# Patient Record
Sex: Female | Born: 1955 | Race: Black or African American | Hispanic: No | Marital: Single | Smoking: Never smoker
Health system: Southern US, Community
[De-identification: ages and names within clinical notes are randomized; demographics above are authoritative.]

## PROBLEM LIST (undated history)

## (undated) HISTORY — PX: ABDOMINAL HYSTERECTOMY: SHX81

## (undated) HISTORY — PX: CHOLECYSTECTOMY: SHX55

## (undated) HISTORY — PX: KNEE ARTHROSCOPY: SUR90

---

## 2016-10-14 ENCOUNTER — Encounter (HOSPITAL_BASED_OUTPATIENT_CLINIC_OR_DEPARTMENT_OTHER): Payer: Self-pay | Admitting: Emergency Medicine

## 2016-10-14 ENCOUNTER — Emergency Department (HOSPITAL_BASED_OUTPATIENT_CLINIC_OR_DEPARTMENT_OTHER)
Admission: EM | Admit: 2016-10-14 | Discharge: 2016-10-14 | Disposition: A | Discharge status: Home / Self Care | Payer: Self-pay | Attending: Emergency Medicine | Admitting: Emergency Medicine

## 2016-10-14 DIAGNOSIS — R42 Dizziness and giddiness: Secondary | ICD-10-CM | POA: Insufficient documentation

## 2016-10-14 DIAGNOSIS — N390 Urinary tract infection, site not specified: Secondary | ICD-10-CM | POA: Insufficient documentation

## 2016-10-14 LAB — CBC WITH DIFFERENTIAL/PLATELET
BASOS ABS: 0 10*3/uL (ref 0.0–0.1)
BASOS PCT: 0 %
EOS ABS: 0 10*3/uL (ref 0.0–0.7)
EOS PCT: 0 %
HCT: 39.3 % (ref 36.0–46.0)
Hemoglobin: 13.3 g/dL (ref 12.0–15.0)
LYMPHS PCT: 51 %
Lymphs Abs: 3.2 10*3/uL (ref 0.7–4.0)
MCH: 30.2 pg (ref 26.0–34.0)
MCHC: 33.8 g/dL (ref 30.0–36.0)
MCV: 89.1 fL (ref 78.0–100.0)
MONO ABS: 0.6 10*3/uL (ref 0.1–1.0)
Monocytes Relative: 10 %
Neutro Abs: 2.4 10*3/uL (ref 1.7–7.7)
Neutrophils Relative %: 39 %
PLATELETS: 236 10*3/uL (ref 150–400)
RBC: 4.41 MIL/uL (ref 3.87–5.11)
RDW: 12.2 % (ref 11.5–15.5)
WBC: 6.2 10*3/uL (ref 4.0–10.5)

## 2016-10-14 LAB — BASIC METABOLIC PANEL
ANION GAP: 9 (ref 5–15)
BUN: 11 mg/dL (ref 6–20)
CALCIUM: 9.1 mg/dL (ref 8.9–10.3)
CO2: 28 mmol/L (ref 22–32)
Chloride: 101 mmol/L (ref 101–111)
Creatinine, Ser: 0.77 mg/dL (ref 0.44–1.00)
GFR calc Af Amer: >60 mL/min (ref >60)
Glucose, Bld: 108 mg/dL — ABNORMAL HIGH (ref 65–99)
POTASSIUM: 3.7 mmol/L (ref 3.5–5.1)
SODIUM: 138 mmol/L (ref 135–145)

## 2016-10-14 LAB — URINALYSIS, ROUTINE W REFLEX MICROSCOPIC
BILIRUBIN URINE: NEGATIVE
Glucose, UA: NEGATIVE mg/dL
Ketones, ur: 15 mg/dL — AB
NITRITE: NEGATIVE
PH: 5 (ref 5.0–8.0)
Protein, ur: NEGATIVE mg/dL
SPECIFIC GRAVITY, URINE: 1.024 (ref 1.005–1.030)

## 2016-10-14 LAB — URINALYSIS, MICROSCOPIC (REFLEX)

## 2016-10-14 LAB — CBG MONITORING, ED: GLUCOSE-CAPILLARY: 102 mg/dL — AB (ref 65–99)

## 2016-10-14 MED ORDER — CEPHALEXIN 500 MG PO CAPS: 500.0000 mg | ORAL_CAPSULE | Freq: Two times a day (BID) | ORAL | 0 refills | Status: DC

## 2016-10-14 MED ORDER — SODIUM CHLORIDE 0.9 % IV BOLUS (SEPSIS)
1000.0000 mL | Freq: Once | INTRAVENOUS | Status: AC
  Administered 2016-10-14: 1000 mL via INTRAVENOUS

## 2016-10-14 MED ORDER — CEPHALEXIN 250 MG PO CAPS
1000.0000 mg | ORAL_CAPSULE | Freq: Once | ORAL | Status: AC
  Administered 2016-10-14: 1000 mg via ORAL
  Filled 2016-10-14: qty 4

## 2016-10-14 NOTE — ED Notes (Signed)
EDP into room 

## 2016-10-14 NOTE — ED Triage Notes (Signed)
PT presents with dizziness, headache and not feeling like herself today.

## 2016-10-14 NOTE — ED Provider Notes (Signed)
[facility] Provider Note: Lowella DellJ. Lane Karder Goodin, MD, FACEP  CSN: [id number] MRN: [id number] ARRIVAL: 10/14/16 at 0102 ROOM: MH02/[facility]02   CHIEF COMPLAINT  Dizziness   HISTORY OF PRESENT ILLNESS  Krystal Gomez is a 61 y.o. female with a one-day history of dizziness by which she means lightheadedness. Symptoms are moderate and worse when standing rapidly. She denies associated chest pain, shortness of breath, vomiting, diarrhea, abdominal pain or fever. She has had chills and nausea. She has had increased thirst and increased frequency of urination but denies dysuria.    History reviewed. No pertinent past medical history.  Past Surgical History:  Procedure Laterality Date  . ABDOMINAL HYSTERECTOMY      No family history on file.  Social History  Substance Use Topics  . Smoking status: Not on file  . Smokeless tobacco: Not on file  . Alcohol use Not on file    Prior to Admission medications   Not on File    Allergies Sulfa antibiotics   REVIEW OF SYSTEMS  Negative except as noted here or in the History of Present Illness.   PHYSICAL EXAMINATION  Initial Vital Signs Blood pressure (!) 148/72, pulse 74, temperature 98.3 F (36.8 C), temperature source Oral, resp. rate 13, height 5\' 2"  (1.575 m), weight 120.2 kg (265 lb), SpO2 100 %.  Examination General: Well-developed, well-nourished female in no acute distress; appearance consistent with age of record HENT: normocephalic; atraumatic Eyes: pupils equal, round and reactive to light; extraocular muscles intact Neck: supple Heart: regular rate and rhythm Lungs: clear to auscultation bilaterally Abdomen: soft; nondistended; mild suprapubic tenderness; no masses or hepatosplenomegaly; bowel sounds present Extremities: No deformity; full range of motion; pulses normal Neurologic: Awake, alert and oriented; motor function intact in all extremities and symmetric; no facial droop Skin: Warm and dry Psychiatric:  Normal mood and affect   RESULTS  Summary of this visit's results, reviewed by myself:   EKG Interpretation  Date/Time:  Sunday October 14 2016 01:15:01 EDT Ventricular Rate:  85 PR Interval:    QRS Duration: 86 QT Interval:  372 QTC Calculation: 443 R Axis:   55 Text Interpretation:  Sinus rhythm Normal ECG No previous ECGs available Confirmed by Paula LibraMolpus, Tahja Liao ([id number]) on 10/14/2016 2:18:36 AM      Laboratory Studies: Results for orders placed or performed during the hospital encounter of 10/14/16 (from the past 24 hour(s))  Urinalysis, Routine w reflex microscopic     Status: Abnormal   Collection Time: 10/14/16  1:21 AM  Result Value Ref Range   Color, Urine YELLOW YELLOW   APPearance CLOUDY (A) CLEAR   Specific Gravity, Urine 1.024 1.005 - 1.030   pH 5.0 5.0 - 8.0   Glucose, UA NEGATIVE NEGATIVE mg/dL   Hgb urine dipstick TRACE (A) NEGATIVE   Bilirubin Urine NEGATIVE NEGATIVE   Ketones, ur 15 (A) NEGATIVE mg/dL   Protein, ur NEGATIVE NEGATIVE mg/dL   Nitrite NEGATIVE NEGATIVE   Leukocytes, UA MODERATE (A) NEGATIVE  Urinalysis, Microscopic (reflex)     Status: Abnormal   Collection Time: 10/14/16  1:21 AM  Result Value Ref Range   RBC / HPF 0-5 0 - 5 RBC/hpf   WBC, UA 6-30 0 - 5 WBC/hpf   Bacteria, UA MANY (A) NONE SEEN   Squamous Epithelial / LPF TOO NUMEROUS TO COUNT (A) NONE SEEN  CBG monitoring, ED     Status: Abnormal   Collection Time: 10/14/16  3:01 AM  Result Value Ref Range  Glucose-Capillary 102 (H) 65 - 99 mg/dL  CBC with Differential/Platelet     Status: None   Collection Time: 10/14/16  3:50 AM  Result Value Ref Range   WBC 6.2 4.0 - 10.5 K/uL   RBC 4.41 3.87 - 5.11 MIL/uL   Hemoglobin 13.3 12.0 - 15.0 g/dL   HCT 81.1 91.4 - 78.2 %   MCV 89.1 78.0 - 100.0 fL   MCH 30.2 26.0 - 34.0 pg   MCHC 33.8 30.0 - 36.0 g/dL   RDW 95.6 21.3 - 08.6 %   Platelets 236 150 - 400 K/uL   Neutrophils Relative % 39 %   Neutro Abs 2.4 1.7 - 7.7 K/uL   Lymphocytes  Relative 51 %   Lymphs Abs 3.2 0.7 - 4.0 K/uL   Monocytes Relative 10 %   Monocytes Absolute 0.6 0.1 - 1.0 K/uL   Eosinophils Relative 0 %   Eosinophils Absolute 0.0 0.0 - 0.7 K/uL   Basophils Relative 0 %   Basophils Absolute 0.0 0.0 - 0.1 K/uL  Basic metabolic panel     Status: Abnormal   Collection Time: 10/14/16  3:50 AM  Result Value Ref Range   Sodium 138 135 - 145 mmol/L   Potassium 3.7 3.5 - 5.1 mmol/L   Chloride 101 101 - 111 mmol/L   CO2 28 22 - 32 mmol/L   Glucose, Bld 108 (H) 65 - 99 mg/dL   BUN 11 6 - 20 mg/dL   Creatinine, Ser 5.78 0.44 - 1.00 mg/dL   Calcium 9.1 8.9 - 46.9 mg/dL   GFR calc non Af Amer >60 >60 mL/min   GFR calc Af Amer >60 >60 mL/min   Anion gap 9 5 - 15   Imaging Studies: No results found.  ED COURSE  Nursing notes and initial vitals signs, including pulse oximetry, reviewed.  Vitals:   10/14/16 0230 10/14/16 0300 10/14/16 0330 10/14/16 0400  BP: 130/77 (!) 145/74 130/75 (!) 145/95  Pulse:  73 66 66  Resp: 15 19 14 12   Temp:      TempSrc:      SpO2:  100% 100% 100%  Weight:      Height:       4:40 AM Feels better after IV fluid bolus. We'll treat for urinary tract infection as the patient is symptomatic.  PROCEDURES    ED DIAGNOSES     ICD-10-CM   1. Lightheadedness R42   2. Lower urinary tract infectious disease N39.0        Ermine Spofford, MD 10/14/16 [phone]

## 2016-10-15 LAB — URINE CULTURE

## 2017-06-15 ENCOUNTER — Emergency Department (HOSPITAL_BASED_OUTPATIENT_CLINIC_OR_DEPARTMENT_OTHER)
Admission: EM | Admit: 2017-06-15 | Discharge: 2017-06-15 | Disposition: A | Discharge status: Home / Self Care | Payer: BLUE CROSS/BLUE SHIELD | Attending: Emergency Medicine | Admitting: Emergency Medicine

## 2017-06-15 ENCOUNTER — Other Ambulatory Visit: Payer: Self-pay

## 2017-06-15 ENCOUNTER — Emergency Department (HOSPITAL_BASED_OUTPATIENT_CLINIC_OR_DEPARTMENT_OTHER): Payer: BLUE CROSS/BLUE SHIELD

## 2017-06-15 ENCOUNTER — Encounter (HOSPITAL_BASED_OUTPATIENT_CLINIC_OR_DEPARTMENT_OTHER): Payer: Self-pay | Admitting: Emergency Medicine

## 2017-06-15 DIAGNOSIS — M545 Low back pain: Secondary | ICD-10-CM | POA: Insufficient documentation

## 2017-06-15 DIAGNOSIS — R200 Anesthesia of skin: Secondary | ICD-10-CM | POA: Insufficient documentation

## 2017-06-15 DIAGNOSIS — R42 Dizziness and giddiness: Secondary | ICD-10-CM | POA: Diagnosis not present

## 2017-06-15 DIAGNOSIS — R531 Weakness: Secondary | ICD-10-CM | POA: Diagnosis not present

## 2017-06-15 LAB — URINALYSIS, ROUTINE W REFLEX MICROSCOPIC
Bilirubin Urine: NEGATIVE
GLUCOSE, UA: NEGATIVE mg/dL
Ketones, ur: NEGATIVE mg/dL
Leukocytes, UA: NEGATIVE
Nitrite: NEGATIVE
PH: 5.5 (ref 5.0–8.0)
PROTEIN: NEGATIVE mg/dL
Specific Gravity, Urine: >1.03 — ABNORMAL HIGH (ref 1.005–1.030)

## 2017-06-15 LAB — CBC
HCT: 39.8 % (ref 36.0–46.0)
Hemoglobin: 13.3 g/dL (ref 12.0–15.0)
MCH: 29.6 pg (ref 26.0–34.0)
MCHC: 33.4 g/dL (ref 30.0–36.0)
MCV: 88.4 fL (ref 78.0–100.0)
PLATELETS: 256 10*3/uL (ref 150–400)
RBC: 4.5 MIL/uL (ref 3.87–5.11)
RDW: 12.2 % (ref 11.5–15.5)
WBC: 6.3 10*3/uL (ref 4.0–10.5)

## 2017-06-15 LAB — URINALYSIS, MICROSCOPIC (REFLEX)

## 2017-06-15 LAB — DIFFERENTIAL
Basophils Absolute: 0 10*3/uL (ref 0.0–0.1)
Basophils Relative: 0 %
EOS ABS: 0 10*3/uL (ref 0.0–0.7)
Eosinophils Relative: 0 %
Lymphocytes Relative: 49 %
Lymphs Abs: 3.1 10*3/uL (ref 0.7–4.0)
Monocytes Absolute: 0.5 10*3/uL (ref 0.1–1.0)
Monocytes Relative: 8 %
NEUTROS PCT: 43 %
Neutro Abs: 2.7 10*3/uL (ref 1.7–7.7)

## 2017-06-15 LAB — COMPREHENSIVE METABOLIC PANEL
ALBUMIN: 3.8 g/dL (ref 3.5–5.0)
ALT: 14 U/L (ref 14–54)
ANION GAP: 7 (ref 5–15)
AST: 18 U/L (ref 15–41)
Alkaline Phosphatase: 87 U/L (ref 38–126)
BILIRUBIN TOTAL: 0.6 mg/dL (ref 0.3–1.2)
BUN: 13 mg/dL (ref 6–20)
CO2: 26 mmol/L (ref 22–32)
Calcium: 9.1 mg/dL (ref 8.9–10.3)
Chloride: 104 mmol/L (ref 101–111)
Creatinine, Ser: 0.76 mg/dL (ref 0.44–1.00)
GFR calc Af Amer: >60 mL/min (ref >60)
GFR calc non Af Amer: >60 mL/min (ref >60)
GLUCOSE: 99 mg/dL (ref 65–99)
POTASSIUM: 3.8 mmol/L (ref 3.5–5.1)
SODIUM: 137 mmol/L (ref 135–145)
TOTAL PROTEIN: 7.5 g/dL (ref 6.5–8.1)

## 2017-06-15 LAB — RAPID URINE DRUG SCREEN, HOSP PERFORMED
Amphetamines: NOT DETECTED
BARBITURATES: NOT DETECTED
BENZODIAZEPINES: NOT DETECTED
COCAINE: NOT DETECTED
Opiates: NOT DETECTED
Tetrahydrocannabinol: NOT DETECTED

## 2017-06-15 LAB — PROTIME-INR
INR: 0.96
PROTHROMBIN TIME: 12.7 s (ref 11.4–15.2)

## 2017-06-15 LAB — TROPONIN I: Troponin I: <0.03 ng/mL (ref <0.03)

## 2017-06-15 LAB — ETHANOL: Alcohol, Ethyl (B): <10 mg/dL (ref <10)

## 2017-06-15 LAB — APTT: aPTT: 34 seconds (ref 24–36)

## 2017-06-15 MED ORDER — MECLIZINE HCL 25 MG PO TABS
25.0000 mg | ORAL_TABLET | Freq: Once | ORAL | Status: AC
  Administered 2017-06-15: 25 mg via ORAL
  Filled 2017-06-15: qty 1

## 2017-06-15 MED ORDER — MECLIZINE HCL 25 MG PO TABS: 25.0000 mg | ORAL_TABLET | Freq: Three times a day (TID) | ORAL | 0 refills | Status: AC | PRN

## 2017-06-15 NOTE — ED Notes (Signed)
Patient transported to X-ray 

## 2017-06-15 NOTE — ED Provider Notes (Signed)
Patient's MRI is negative.  No acute stroke or mass.  She is feeling better after Antivert.  She will be given Antivert prescription and instructed to follow-up with neurology.  I doubt these are TIAs.  Discharge home with return precautions.   Pricilla LovelessGoldston, Trapper Meech, MD 06/15/17 [phone]

## 2017-06-15 NOTE — ED Notes (Signed)
ED Provider at bedside. 

## 2017-06-15 NOTE — ED Provider Notes (Signed)
[facility] EMERGENCY DEPARTMENT Provider Note   CSN: [id number] Arrival date & time: 06/15/17  1250     History   Chief Complaint Chief Complaint  Patient presents with  . Weakness    HPI Krystal Gomez is a 62 y.o. female.  Patient presents with several complaints.  She has had weakness lightheadedness and some dizziness and some room spinning for a week.  Right lower lumbar back pain for a week.  Has had intermittent right arm numbness usually last for 10 minutes as happen several times over the past few days.  None present now.  No headache as mentioned yes to dizziness and vertigo.  Patient has not had vertigo in the past.  Patient currently does not have a primary care doctor.      History reviewed. No pertinent past medical history.  There are no active problems to display for this patient.   Past Surgical History:  Procedure Laterality Date  . ABDOMINAL HYSTERECTOMY    . KNEE ARTHROSCOPY      OB History    No data available       Home Medications    Prior to Admission medications   Medication Sig Start Date End Date Taking? Authorizing Provider  cephALEXin (KEFLEX) 500 MG capsule Take 1 capsule (500 mg total) by mouth 2 (two) times daily. 10/14/16   Molpus, John, MD    Family History No family history on file.  Social History Social History   Tobacco Use  . Smoking status: Never Smoker  . Smokeless tobacco: Never Used  Substance Use Topics  . Alcohol use: No    Frequency: Never  . Drug use: No     Allergies   Sulfa antibiotics   Review of Systems Review of Systems  Constitutional: Negative for fever.  HENT: Negative for congestion.   Eyes: Negative for visual disturbance.  Respiratory: Negative for shortness of breath.   Cardiovascular: Negative for chest pain.  Gastrointestinal: Negative for abdominal pain.  Genitourinary: Negative for dysuria.  Musculoskeletal: Positive for back pain. Negative for myalgias.  Skin:  Negative for rash.  Neurological: Positive for dizziness, weakness, light-headedness and numbness. Negative for syncope, speech difficulty and headaches.  Hematological: Does not bruise/bleed easily.  Psychiatric/Behavioral: Negative for confusion.     Physical Exam Updated Vital Signs BP (!) 148/79   Pulse 75   Temp 98.4 F (36.9 C)   Resp 11   Ht 1.575 m (5\' 2" )   Wt 97.5 kg (215 lb)   SpO2 100%   BMI 39.32 kg/m   Physical Exam  Constitutional: She is oriented to person, place, and time. She appears well-developed and well-nourished. No distress.  HENT:  Head: Normocephalic and atraumatic.  Mouth/Throat: Oropharynx is clear and moist.  Eyes: Conjunctivae and EOM are normal. Pupils are equal, round, and reactive to light.  Neck: Neck supple.  Cardiovascular: Normal rate, regular rhythm and normal heart sounds.  Pulmonary/Chest: Effort normal and breath sounds normal. No respiratory distress.  Abdominal: Soft. Bowel sounds are normal. There is no tenderness.  Musculoskeletal: Normal range of motion. She exhibits tenderness.  Mild tenderness to palpation to the right lumbar area.  The right upper extremity with good radial pulse good cap refill sensation intact.  Neurological: She is alert and oriented to person, place, and time. No cranial nerve deficit or sensory deficit. She exhibits normal muscle tone. Coordination normal.  Skin: Skin is warm.  Nursing note and vitals reviewed.    ED Treatments /  Results  Labs (all labs ordered are listed, but only abnormal results are displayed) Labs Reviewed  PROTIME-INR  APTT  CBC  DIFFERENTIAL  COMPREHENSIVE METABOLIC PANEL  TROPONIN I  ETHANOL  RAPID URINE DRUG SCREEN, HOSP PERFORMED  URINALYSIS, ROUTINE W REFLEX MICROSCOPIC    EKG  EKG Interpretation  Date/Time:  Saturday June 15 2017 13:02:12 EST Ventricular Rate:  86 PR Interval:    QRS Duration: 80 QT Interval:  361 QTC Calculation: 432 R Axis:   45 Text  Interpretation:  Sinus rhythm Confirmed by Vanetta MuldersZackowski, Maria Coin [phone] on 06/15/2017 1:04:27 PM       Radiology Dg Chest 2 View  Result Date: 06/15/2017 CLINICAL DATA:  Fever, headache, dizziness EXAM: CHEST - 2 VIEW COMPARISON:  04/12/2015 FINDINGS: Heart and mediastinal contours are within normal limits. No focal opacities or effusions. No acute bony abnormality. IMPRESSION: No active cardiopulmonary disease. Electronically Signed   By: Charlett NoseKevin  Dover M.D.   On: 06/15/2017 15:58    Procedures Procedures (including critical care time)  Medications Ordered in ED Medications - No data to display   Initial Impression / Assessment and Plan / ED Course  I have reviewed the triage vital signs and the nursing notes.  Pertinent labs & imaging results that were available during my care of the patient were reviewed by me and considered in my medical decision making (see chart for details).     Patient with multiple symptoms.  The most worrisome symptoms are the vertigo with dizziness that has been present for a week which could represent a stroke.  Also the right arm numbness but it is intermittent.  This patient will get a trial of Antivert here MRI has been arranged to be done here.  If MRI negative patient can be discharged home with follow-up with neurology for vertigo with Antivert.  Patient's basic labs without significant abnormalities.  Chest x-ray without any acute findings.  Head CT was not done first because MRI which shutting down at 5 so went directly to MRI with their present permission.  Final Clinical Impressions(s) / ED Diagnoses   Final diagnoses:  Dizziness  Weakness  Right arm numbness    ED Discharge Orders    None       Vanetta MuldersZackowski, Orley Lawry, MD 06/15/17 1625

## 2017-06-15 NOTE — ED Triage Notes (Signed)
Pt presents with multiple complaints including generalized weakness, intermittent lightheadedness, nausea, back pain and L arm numbness x 1 week.

## 2017-06-15 NOTE — ED Notes (Signed)
Pt returned from MRI °

## 2017-06-17 LAB — URINE CULTURE

## 2017-07-15 ENCOUNTER — Encounter (HOSPITAL_BASED_OUTPATIENT_CLINIC_OR_DEPARTMENT_OTHER): Payer: Self-pay | Admitting: Emergency Medicine

## 2017-07-15 ENCOUNTER — Other Ambulatory Visit: Payer: Self-pay

## 2017-07-15 ENCOUNTER — Emergency Department (HOSPITAL_BASED_OUTPATIENT_CLINIC_OR_DEPARTMENT_OTHER)
Admission: EM | Admit: 2017-07-15 | Discharge: 2017-07-15 | Disposition: A | Discharge status: Home / Self Care | Payer: No Typology Code available for payment source | Attending: Emergency Medicine | Admitting: Emergency Medicine

## 2017-07-15 DIAGNOSIS — Y92410 Unspecified street and highway as the place of occurrence of the external cause: Secondary | ICD-10-CM | POA: Diagnosis not present

## 2017-07-15 DIAGNOSIS — M549 Dorsalgia, unspecified: Secondary | ICD-10-CM | POA: Diagnosis not present

## 2017-07-15 DIAGNOSIS — Y998 Other external cause status: Secondary | ICD-10-CM | POA: Insufficient documentation

## 2017-07-15 DIAGNOSIS — M542 Cervicalgia: Secondary | ICD-10-CM | POA: Insufficient documentation

## 2017-07-15 DIAGNOSIS — Y9389 Activity, other specified: Secondary | ICD-10-CM | POA: Diagnosis not present

## 2017-07-15 MED ORDER — CYCLOBENZAPRINE HCL 10 MG PO TABS: 10.0000 mg | ORAL_TABLET | Freq: Three times a day (TID) | ORAL | 0 refills | Status: AC | PRN

## 2017-07-15 MED ORDER — IBUPROFEN 800 MG PO TABS
800.0000 mg | ORAL_TABLET | Freq: Once | ORAL | Status: AC
  Administered 2017-07-15: 800 mg via ORAL
  Filled 2017-07-15: qty 1

## 2017-07-15 MED ORDER — OXYCODONE-ACETAMINOPHEN 5-325 MG PO TABS
1.0000 | ORAL_TABLET | Freq: Once | ORAL | Status: AC
  Administered 2017-07-15: 1 via ORAL
  Filled 2017-07-15: qty 1

## 2017-07-15 MED ORDER — NAPROXEN 375 MG PO TABS: 375.0000 mg | ORAL_TABLET | Freq: Two times a day (BID) | ORAL | 0 refills | Status: AC | PRN

## 2017-07-15 MED FILL — NAPROXEN 375 MG TABLET: 375 | 10 days supply | Qty: 20 | Fill #0

## 2017-07-15 MED FILL — CYCLOBENZAPRINE HCL 10 MG T: 10 | 4 days supply | Qty: 12 | Fill #0
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
## 2017-07-16 ENCOUNTER — Encounter (HOSPITAL_BASED_OUTPATIENT_CLINIC_OR_DEPARTMENT_OTHER): Payer: Self-pay | Admitting: Emergency Medicine

## 2017-11-24 ENCOUNTER — Other Ambulatory Visit: Payer: Self-pay

## 2017-11-24 ENCOUNTER — Encounter (HOSPITAL_BASED_OUTPATIENT_CLINIC_OR_DEPARTMENT_OTHER): Payer: Self-pay | Admitting: Emergency Medicine

## 2017-11-24 ENCOUNTER — Emergency Department (HOSPITAL_BASED_OUTPATIENT_CLINIC_OR_DEPARTMENT_OTHER): Payer: BLUE CROSS/BLUE SHIELD

## 2017-11-24 ENCOUNTER — Emergency Department (HOSPITAL_BASED_OUTPATIENT_CLINIC_OR_DEPARTMENT_OTHER)
Admission: EM | Admit: 2017-11-24 | Discharge: 2017-11-24 | Disposition: A | Discharge status: Home / Self Care | Payer: BLUE CROSS/BLUE SHIELD | Attending: Emergency Medicine | Admitting: Emergency Medicine

## 2017-11-24 DIAGNOSIS — K5900 Constipation, unspecified: Secondary | ICD-10-CM | POA: Insufficient documentation

## 2017-11-24 DIAGNOSIS — Z79899 Other long term (current) drug therapy: Secondary | ICD-10-CM | POA: Insufficient documentation

## 2017-11-24 DIAGNOSIS — R109 Unspecified abdominal pain: Secondary | ICD-10-CM

## 2017-11-24 DIAGNOSIS — R1011 Right upper quadrant pain: Secondary | ICD-10-CM | POA: Diagnosis present

## 2017-11-24 LAB — LIPASE, BLOOD: Lipase: 23 U/L (ref 11–51)

## 2017-11-24 LAB — CBC WITH DIFFERENTIAL/PLATELET
BASOS ABS: 0 10*3/uL (ref 0.0–0.1)
Basophils Relative: 0 %
EOS PCT: 0 %
Eosinophils Absolute: 0 10*3/uL (ref 0.0–0.7)
HCT: 40.7 % (ref 36.0–46.0)
HEMOGLOBIN: 13.7 g/dL (ref 12.0–15.0)
Lymphocytes Relative: 42 %
Lymphs Abs: 2.4 10*3/uL (ref 0.7–4.0)
MCH: 29.7 pg (ref 26.0–34.0)
MCHC: 33.7 g/dL (ref 30.0–36.0)
MCV: 88.3 fL (ref 78.0–100.0)
Monocytes Absolute: 0.5 10*3/uL (ref 0.1–1.0)
Monocytes Relative: 9 %
NEUTROS ABS: 2.8 10*3/uL (ref 1.7–7.7)
NEUTROS PCT: 49 %
PLATELETS: 260 10*3/uL (ref 150–400)
RBC: 4.61 MIL/uL (ref 3.87–5.11)
RDW: 12.3 % (ref 11.5–15.5)
WBC: 5.6 10*3/uL (ref 4.0–10.5)

## 2017-11-24 LAB — URINALYSIS, ROUTINE W REFLEX MICROSCOPIC
BILIRUBIN URINE: NEGATIVE
GLUCOSE, UA: NEGATIVE mg/dL
Ketones, ur: NEGATIVE mg/dL
Leukocytes, UA: NEGATIVE
Nitrite: NEGATIVE
PH: 5 (ref 5.0–8.0)
Protein, ur: NEGATIVE mg/dL
SPECIFIC GRAVITY, URINE: 1.015 (ref 1.005–1.030)

## 2017-11-24 LAB — COMPREHENSIVE METABOLIC PANEL
ALK PHOS: 83 U/L (ref 38–126)
ALT: 17 U/L (ref 0–44)
AST: 23 U/L (ref 15–41)
Albumin: 3.7 g/dL (ref 3.5–5.0)
Anion gap: 9 (ref 5–15)
BUN: 8 mg/dL (ref 8–23)
CALCIUM: 9.1 mg/dL (ref 8.9–10.3)
CHLORIDE: 105 mmol/L (ref 98–111)
CO2: 25 mmol/L (ref 22–32)
CREATININE: 0.78 mg/dL (ref 0.44–1.00)
GFR calc Af Amer: >60 mL/min (ref >60)
Glucose, Bld: 103 mg/dL — ABNORMAL HIGH (ref 70–99)
Potassium: 4.5 mmol/L (ref 3.5–5.1)
SODIUM: 139 mmol/L (ref 135–145)
Total Bilirubin: 0.5 mg/dL (ref 0.3–1.2)
Total Protein: 7.2 g/dL (ref 6.5–8.1)

## 2017-11-24 LAB — URINALYSIS, MICROSCOPIC (REFLEX)

## 2017-11-24 MED ORDER — DICYCLOMINE HCL 20 MG PO TABS: 20.0000 mg | ORAL_TABLET | Freq: Two times a day (BID) | ORAL | 0 refills | Status: AC | PRN

## 2017-11-24 MED ORDER — POLYETHYLENE GLYCOL 3350 17 G PO PACK: 17.0000 g | PACK | Freq: Every day | ORAL | 0 refills | Status: AC | PRN

## 2017-11-24 NOTE — ED Provider Notes (Signed)
[facility] EMERGENCY DEPARTMENT Provider Note   CSN: [id number] Arrival date & time: 11/24/17  1126     History   Chief Complaint Chief Complaint  Patient presents with  . Flank Pain    HPI Krystal Gomez is a 62 y.o. female.  HPI Patient presents with 1 week of right flank pain that radiates to the right upper abdomen.  Associated with nausea.  States the pain is constantly present but peaks at times.  Denies any dysuria, hematuria, frequency or urgency.  No fever or chills.  No radiation of pain to the leg. History reviewed. No pertinent past medical history.  There are no active problems to display for this patient.   Past Surgical History:  Procedure Laterality Date  . ABDOMINAL HYSTERECTOMY    . CHOLECYSTECTOMY    . KNEE ARTHROSCOPY       OB History   None      Home Medications    Prior to Admission medications   Medication Sig Start Date End Date Taking? Authorizing Provider  cephALEXin (KEFLEX) 500 MG capsule Take 1 capsule (500 mg total) by mouth 2 (two) times daily. 10/14/16   Molpus, John, MD  cyclobenzaprine (FLEXERIL) 10 MG tablet Take 1 tablet (10 mg total) by mouth 3 (three) times daily as needed for muscle spasms. 07/15/17   Raeford RazorKohut, Stephen, MD  dicyclomine (BENTYL) 20 MG tablet Take 1 tablet (20 mg total) by mouth 2 (two) times daily as needed for spasms. 11/24/17   Loren RacerYelverton, Tu Shimmel, MD  meclizine (ANTIVERT) 25 MG tablet Take 1 tablet (25 mg total) by mouth 3 (three) times daily as needed for dizziness. 06/15/17   Pricilla LovelessGoldston, Scott, MD  naproxen (NAPROSYN) 375 MG tablet Take 1 tablet (375 mg total) by mouth 2 (two) times daily as needed. 07/15/17   Raeford RazorKohut, Stephen, MD  polyethylene glycol [facility](MIRALAX / Ethelene HalGLYCOLAX) packet Take 17 g by mouth daily as needed for moderate constipation or severe constipation. 11/24/17   Loren RacerYelverton, Allayah Raineri, MD    Family History No family history on file.  Social History Social History   Tobacco Use  . Smoking status: Never  Smoker  . Smokeless tobacco: Never Used  Substance Use Topics  . Alcohol use: Not Currently    Frequency: Never  . Drug use: Never     Allergies   Sulfa antibiotics and Sulfa antibiotics   Review of Systems Review of Systems  Constitutional: Negative for chills and fever.  Respiratory: Negative for cough and shortness of breath.   Cardiovascular: Negative for chest pain.  Gastrointestinal: Positive for abdominal pain and nausea. Negative for constipation, diarrhea and vomiting.  Genitourinary: Positive for flank pain. Negative for difficulty urinating, dysuria, frequency, hematuria and pelvic pain.  Musculoskeletal: Positive for back pain and myalgias. Negative for neck pain and neck stiffness.  Skin: Negative for rash and wound.  Neurological: Negative for dizziness, weakness, light-headedness, numbness and headaches.  All other systems reviewed and are negative.    Physical Exam Updated Vital Signs BP (!) 158/89 (BP Location: Left Arm)   Pulse 87   Temp 98.2 F (36.8 C) (Oral)   Resp 18   Ht 5\' 2"  (1.575 m)   Wt 117.2 kg   SpO2 99%   BMI 47.26 kg/m   Physical Exam  Constitutional: She is oriented to person, place, and time. She appears well-developed and well-nourished.  HENT:  Head: Normocephalic and atraumatic.  Eyes: Pupils are equal, round, and reactive to light. EOM are normal.  Neck:  Normal range of motion. Neck supple. No JVD present.  Cardiovascular: Normal rate and regular rhythm. Exam reveals no gallop and no friction rub.  No murmur heard. Pulmonary/Chest: Effort normal and breath sounds normal. No stridor. No respiratory distress. She has no wheezes. She has no rales. She exhibits no tenderness.  Abdominal: Soft. Bowel sounds are normal. There is tenderness. There is no rebound and no guarding.  Right upper quadrant tenderness to palpation.  No rebound or guarding.  Musculoskeletal: Normal range of motion. She exhibits no edema or tenderness.  Right  lumbar paraspinal tenderness with questionable CVA tenderness on the right.  No midline thoracic tenderness.  Patient does have superior midline lumbar tenderness to palpation.  No lower extremity swelling, asymmetry or tenderness.  Distal pulses intact.  Lymphadenopathy:    She has no cervical adenopathy.  Neurological: She is alert and oriented to person, place, and time.  Moves all extremities without focal deficit.  Sensation fully intact.  Skin: Skin is warm and dry. Capillary refill takes less than 2 seconds. No rash noted. No erythema.  Psychiatric: She has a normal mood and affect. Her behavior is normal.  Nursing note and vitals reviewed.    ED Treatments / Results  Labs (all labs ordered are listed, but only abnormal results are displayed) Labs Reviewed  URINALYSIS, ROUTINE W REFLEX MICROSCOPIC - Abnormal; Notable for the following components:      Result Value   Hgb urine dipstick SMALL (*)    All other components within normal limits  URINALYSIS, MICROSCOPIC (REFLEX) - Abnormal; Notable for the following components:   Bacteria, UA MANY (*)    All other components within normal limits  COMPREHENSIVE METABOLIC PANEL - Abnormal; Notable for the following components:   Glucose, Bld 103 (*)    All other components within normal limits  CBC WITH DIFFERENTIAL/PLATELET  LIPASE, BLOOD    EKG None  Radiology Ct Renal Stone Study  Result Date: 11/24/2017 CLINICAL DATA:  Right flank pain for several days. Dysuria. Hysterectomy and cholecystectomy. EXAM: CT ABDOMEN AND PELVIS WITHOUT CONTRAST TECHNIQUE: Multidetector CT imaging of the abdomen and pelvis was performed following the standard protocol without IV contrast. COMPARISON:  None. FINDINGS: Lower chest: Clear lung bases. Normal heart size with minimal pericardial fluid, likely physiologic. Tiny hiatal hernia. Hepatobiliary: Normal noncontrast appearance of the liver. Cholecystectomy, without biliary ductal dilatation.  Pancreas: Normal, without mass or ductal dilatation. Spleen: Normal in size, without focal abnormality. Adrenals/Urinary Tract: Normal adrenal glands. No renal calculi or hydronephrosis. No hydroureter or ureteric calculi. No bladder calculi. Stomach/Bowel: Normal remainder of the stomach. Colonic stool burden suggests constipation. Normal terminal ileum and appendix. Normal small bowel. Vascular/Lymphatic: Normal caliber of the aorta and branch vessels. No abdominopelvic adenopathy. Reproductive: Hysterectomy.  No adnexal mass. Other: No significant free fluid. Musculoskeletal: No acute osseous abnormality. IMPRESSION: 1.  No urinary tract calculi or hydronephrosis. 2.  Possible constipation. Electronically Signed   By: Jeronimo GreavesKyle  Talbot M.D.   On: 11/24/2017 13:05    Procedures Procedures (including critical care time)  Medications Ordered in ED Medications - No data to display   Initial Impression / Assessment and Plan / ED Course  I have reviewed the triage vital signs and the nursing notes.  Pertinent labs & imaging results that were available during my care of the patient were reviewed by me and considered in my medical decision making (see chart for details).      CT with evidence of moderate amount of stool  in the colon.  Will treat for constipation.  Return precautions given. Final Clinical Impressions(s) / ED Diagnoses   Final diagnoses:  Right flank pain  Constipation, unspecified constipation type    ED Discharge Orders         Ordered    polyethylene glycol (MIRALAX / GLYCOLAX) packet  Daily PRN     11/24/17 1319    dicyclomine (BENTYL) 20 MG tablet  2 times daily PRN     11/24/17 1319           Loren Racer, MD 11/24/17 1320

## 2017-11-24 NOTE — ED Triage Notes (Signed)
R flank pain x 1 week with nausea

## 2020-02-12 ENCOUNTER — Ambulatory Visit
Admission: RE | Admit: 2020-02-12 | Discharge: 2020-02-12 | Disposition: A | Discharge status: Home / Self Care | Payer: 59 | Source: Ambulatory Visit | Attending: Sports Medicine | Admitting: Sports Medicine

## 2020-02-12 ENCOUNTER — Other Ambulatory Visit: Payer: Self-pay | Admitting: Sports Medicine

## 2020-02-12 DIAGNOSIS — M779 Enthesopathy, unspecified: Secondary | ICD-10-CM

## 2021-07-10 IMAGING — DX DG SHOULDER 2+V*L*
3 series · 3 of 3 positions shown · non-contrast
Comparison: None.

CLINICAL DATA: Pain for 3 weeks

EXAM:
LEFT SHOULDER - 2+ VIEW

[dg shoulder left (1 of 3)]
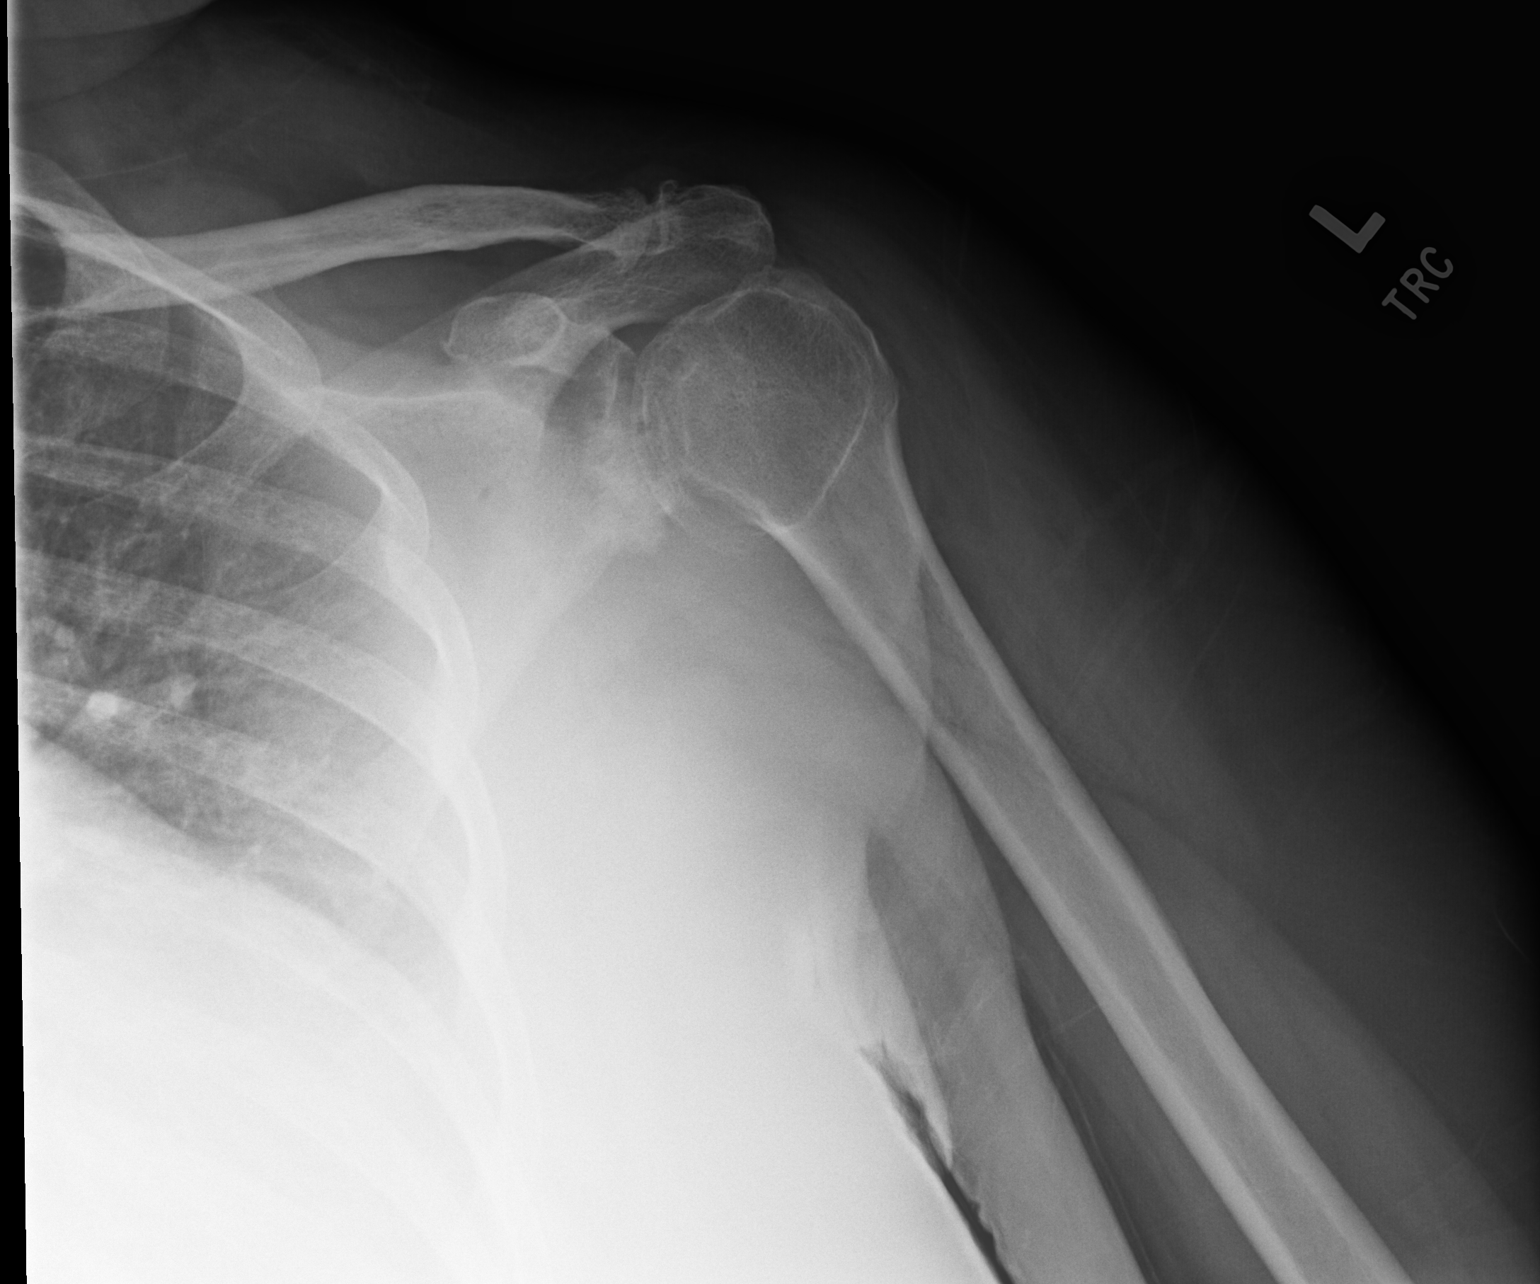

[dg shoulder left (2 of 3)]
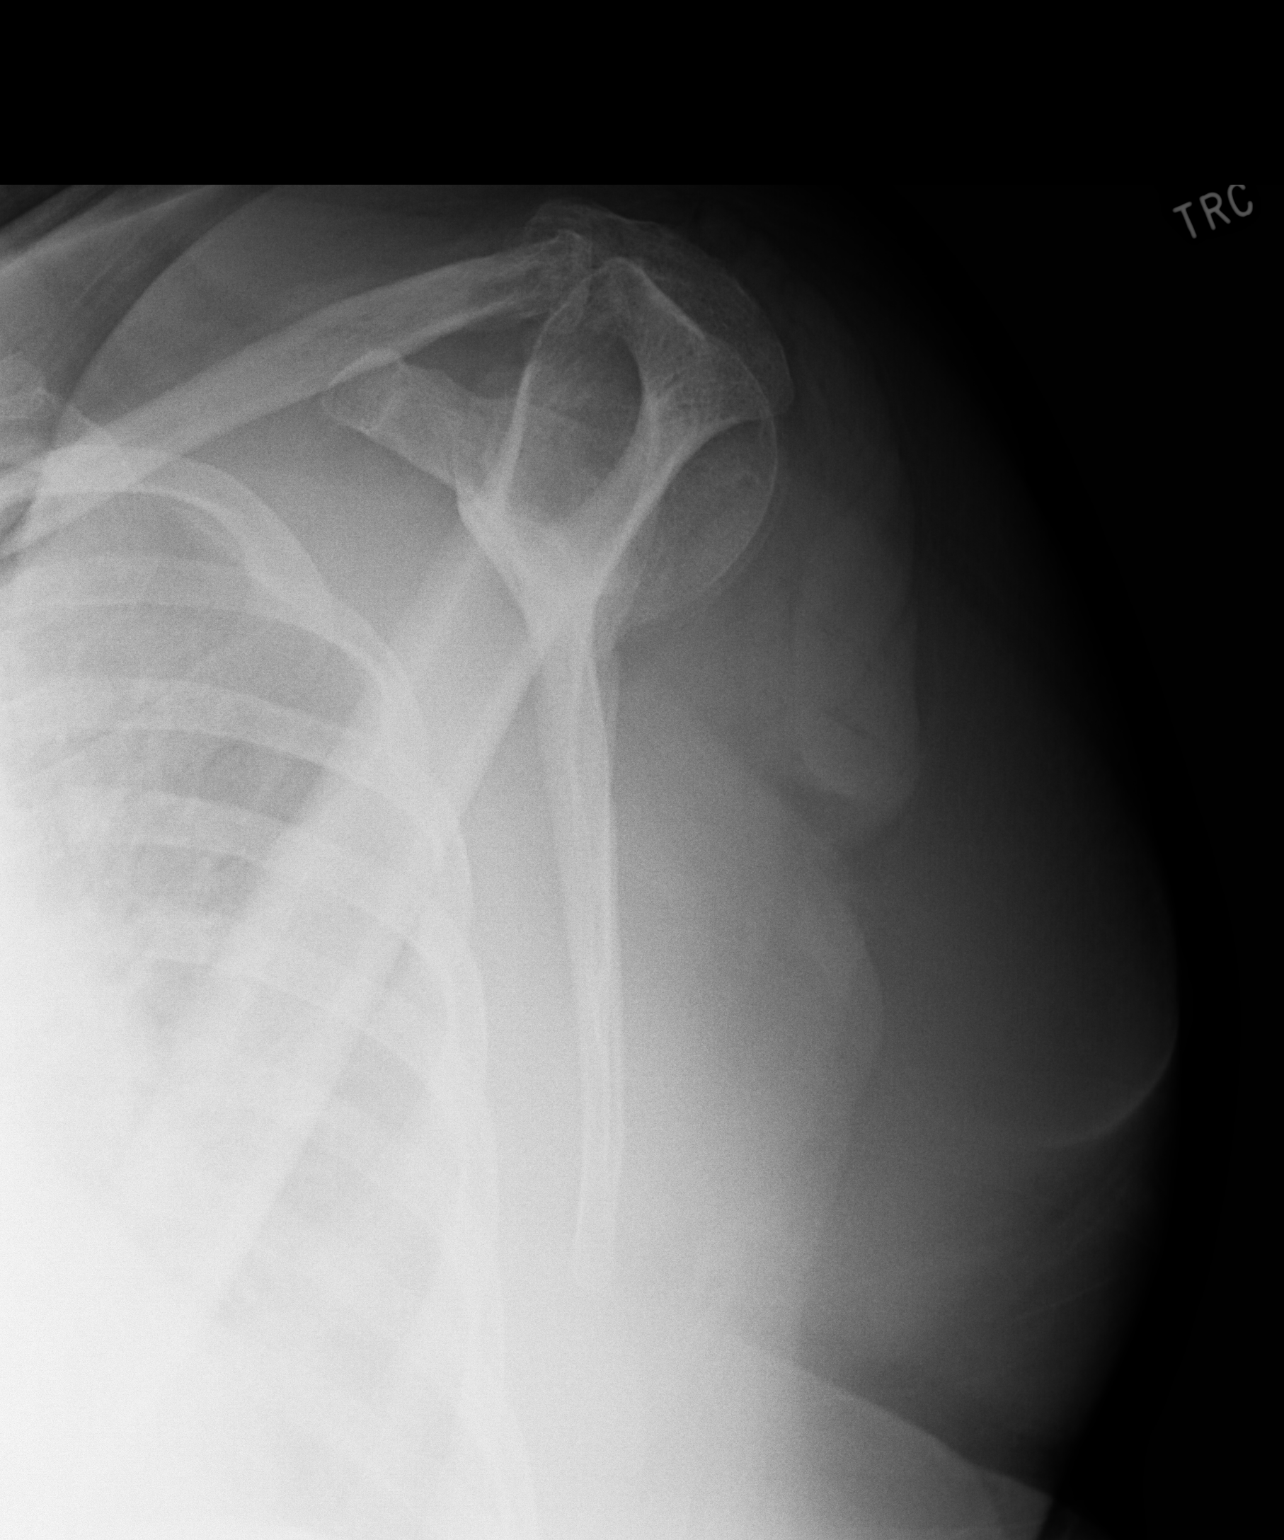

[dg shoulder left (3 of 3)]
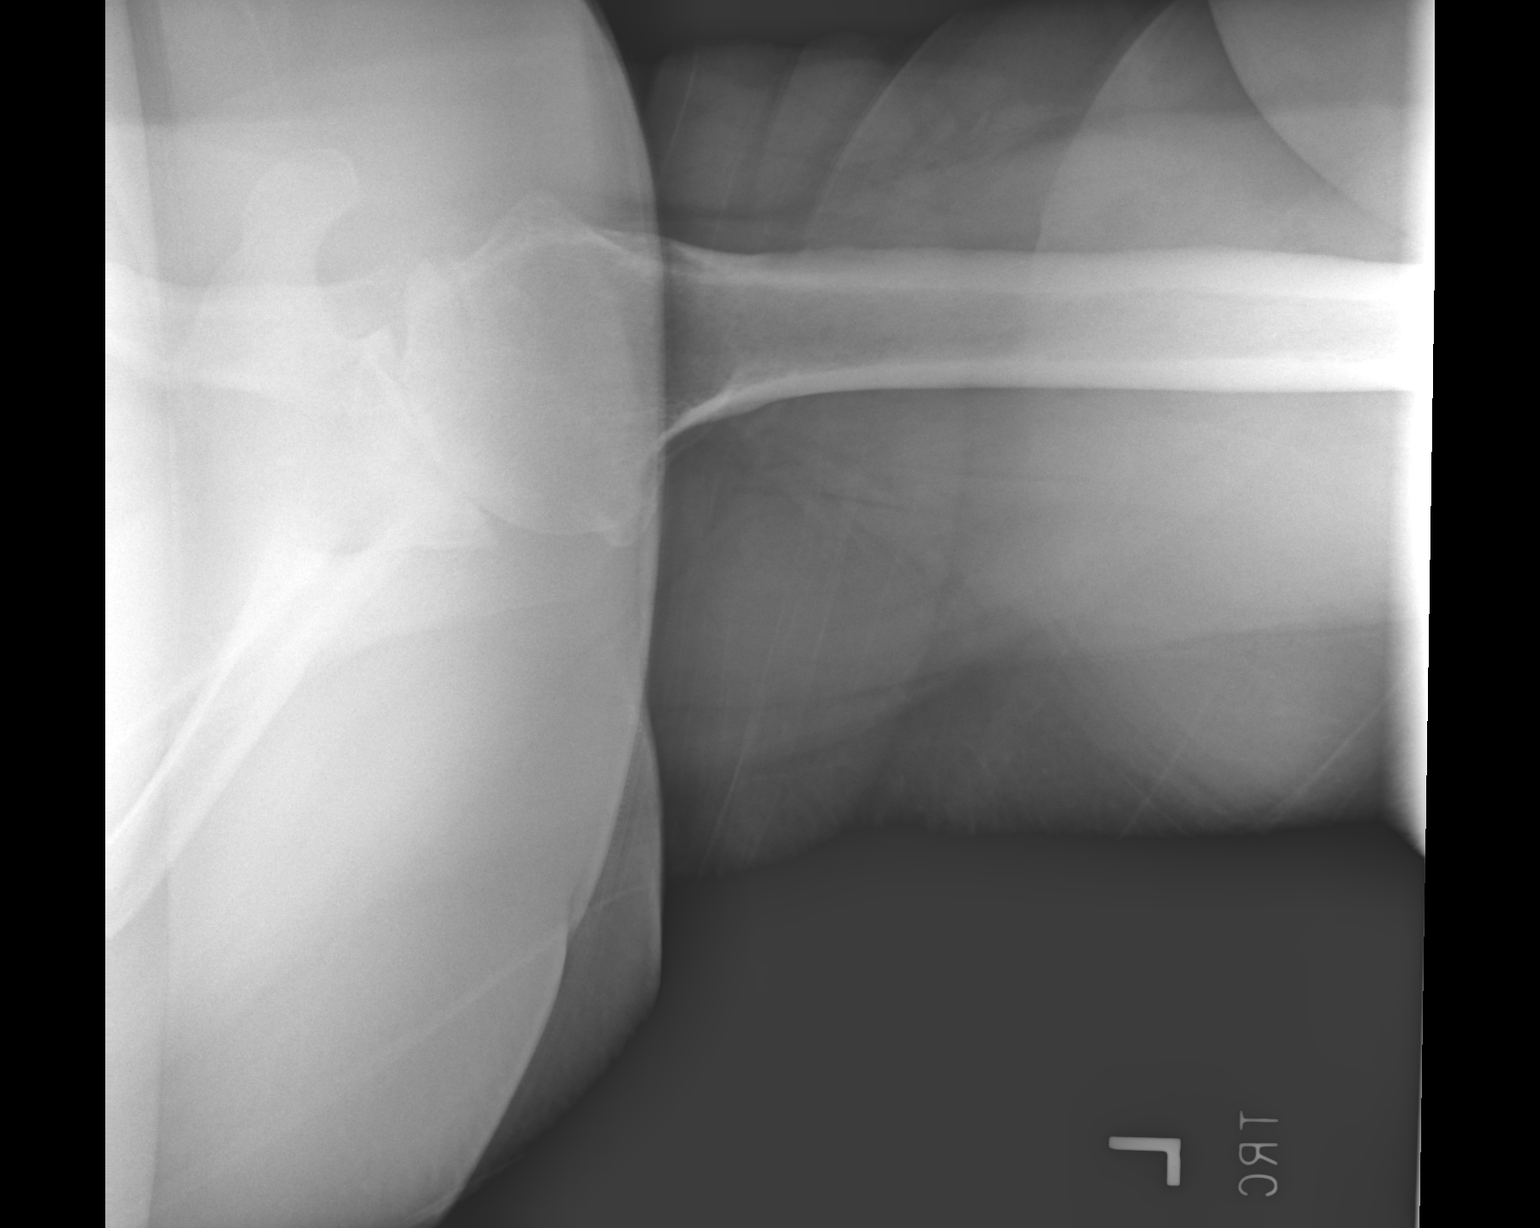

[3 of 3 positions shown; findings below may reference images not displayed]

FINDINGS: Oblique, Y scapular, and axillary images were obtained. No fracture
or dislocation. There is generalized joint space narrowing involving
the acromioclavicular and glenohumeral joints. Sclerosis is noted
along the mid to inferior glenoid. There is bony overgrowth along
the inferolateral aspect of the distal clavicle. No erosive change
or intra-articular calcification. Visualized left lung clear.
IMPRESSION: Generalized osteoarthritic change. Bony overgrowth along the
inferolateral clavicle may result in increased risk of impingement
syndrome. No erosion. No fracture or dislocation.

## 2021-08-15 ENCOUNTER — Other Ambulatory Visit (HOSPITAL_BASED_OUTPATIENT_CLINIC_OR_DEPARTMENT_OTHER): Payer: Self-pay

## 2021-08-15 ENCOUNTER — Emergency Department (HOSPITAL_BASED_OUTPATIENT_CLINIC_OR_DEPARTMENT_OTHER)
Admission: EM | Admit: 2021-08-15 | Discharge: 2021-08-15 | Disposition: A | Discharge status: Home / Self Care | Payer: Medicare HMO | Attending: Emergency Medicine | Admitting: Emergency Medicine

## 2021-08-15 ENCOUNTER — Other Ambulatory Visit: Payer: Self-pay

## 2021-08-15 ENCOUNTER — Emergency Department (HOSPITAL_BASED_OUTPATIENT_CLINIC_OR_DEPARTMENT_OTHER): Payer: Medicare HMO

## 2021-08-15 ENCOUNTER — Encounter (HOSPITAL_BASED_OUTPATIENT_CLINIC_OR_DEPARTMENT_OTHER): Payer: Self-pay | Admitting: *Deleted

## 2021-08-15 DIAGNOSIS — Z28310 Unvaccinated for covid-19: Secondary | ICD-10-CM | POA: Diagnosis not present

## 2021-08-15 DIAGNOSIS — J029 Acute pharyngitis, unspecified: Secondary | ICD-10-CM | POA: Diagnosis not present

## 2021-08-15 DIAGNOSIS — R059 Cough, unspecified: Secondary | ICD-10-CM | POA: Insufficient documentation

## 2021-08-15 DIAGNOSIS — J069 Acute upper respiratory infection, unspecified: Secondary | ICD-10-CM

## 2021-08-15 DIAGNOSIS — R6883 Chills (without fever): Secondary | ICD-10-CM | POA: Diagnosis not present

## 2021-08-15 DIAGNOSIS — I10 Essential (primary) hypertension: Secondary | ICD-10-CM

## 2021-08-15 DIAGNOSIS — J3489 Other specified disorders of nose and nasal sinuses: Secondary | ICD-10-CM | POA: Insufficient documentation

## 2021-08-15 MED ORDER — BENZONATATE 100 MG PO CAPS
100.0000 mg | ORAL_CAPSULE | Freq: Three times a day (TID) | ORAL | 0 refills | Status: AC | PRN
  Filled 2021-08-15: qty 21, 7d supply, fill #0

## 2021-08-15 NOTE — ED Notes (Signed)
Pt discharged to home. Discharge instructions have been discussed with patient and/or family members. Pt verbally acknowledges understanding d/c instructions, and endorses comprehension to checkout at registration before leaving.  °

## 2021-08-15 NOTE — Discharge Instructions (Addendum)
You came to the emergency department today with symptoms consistent of a viral upper respiratory infection.  You refused testing for COVID-19.  We are unsure if you have COVID-19 at this time and therefore as a precaution, please isolate at home for at least 7 days after the day your symptoms initially began, and THEN at least 24 hours after you are fever-free without the help of medications (Tylenol/acetaminophen and Advil/ibuprofen/Motrin) AND your symptoms are improving. ? ?You can alternate Tylenol/acetaminophen and Advil/ibuprofen/Motrin every 4 hours for sore throat, body aches, headache or fever.  ?Drink plenty of water.  ?Use saline nasal spray for congestion. ?You can take Tessalon every 8 hours as needed for cough. ?Wash your hands frequently. ?Please rest as needed with frequent repositioning and ambulation as tolerated.   ?If your symptoms do not improve please follow-up with your primary care provider or urgent care. ? ?Return to the ER for significant shortness of breath, uncontrollable vomiting, severe chest pain, inability to tolerate fluids, changes in mental status such as confusion or other concerning symptoms. ? ?Additionally your blood pressure found to be elevated in the emergency department.  Please check your blood pressure as we discussed and follow-up closely with your primary care doctor for further management of your blood pressure. ?

## 2021-08-15 NOTE — ED Triage Notes (Signed)
Pt is here for cough and chills with itchy eyes since Friday.   ?

## 2021-08-15 NOTE — ED Provider Notes (Signed)
?[facility] EMERGENCY DEPARTMENT ?Provider Note ? ? ?CSN: [id number] ?Arrival date & time: 08/15/21  0854 ? ?  ? ?History ? ?Chief Complaint  ?Patient presents with  ? URI  ? ? ?Krystal Gomez is a 66 y.o. female presents to the emergency department with a chief complaint of cough and chills.  Patient reports that her symptoms started on Saturday.  Patient reports that cough is producing yellow mucus.  Patient denies any shortness of breath or difficulty breathing.  Patient reports that she has also had chills intermittently since Friday.  Patient is having a "dry throat," and itchy eyes since Friday as well.  Patient denies any known sick contacts.  Patient has been vaccinated for influenza and has not been vaccinated for COVID-19. ? ?Patient denies any fever, nasal congestion, trismus, drooling, hot potato voice, shortness of breath, chest pain, abdominal pain, nausea, vomiting, diarrhea, dysuria, hematuria, urinary urgency, vaginal pain, vaginal bleeding, vaginal discharge. ? ? ?URI ?Presenting symptoms: cough, rhinorrhea and sore throat   ?Presenting symptoms: no congestion and no fever   ?Associated symptoms: no headaches and no neck pain   ? ?  ? ?Home Medications ?Prior to Admission medications   ?Medication Sig Start Date End Date Taking? Authorizing Provider  ?cephALEXin (KEFLEX) 500 MG capsule Take 1 capsule (500 mg total) by mouth 2 (two) times daily. 10/14/16   Molpus, John, MD  ?cyclobenzaprine (FLEXERIL) 10 MG tablet Take 1 tablet (10 mg total) by mouth 3 (three) times daily as needed for muscle spasms. 07/15/17   Raeford Razor, MD  ?dicyclomine (BENTYL) 20 MG tablet Take 1 tablet (20 mg total) by mouth 2 (two) times daily as needed for spasms. 11/24/17   Loren Racer, MD  ?meclizine (ANTIVERT) 25 MG tablet Take 1 tablet (25 mg total) by mouth 3 (three) times daily as needed for dizziness. 06/15/17   Pricilla Loveless, MD  ?naproxen (NAPROSYN) 375 MG tablet Take 1 tablet (375 mg total) by mouth 2  (two) times daily as needed. 07/15/17   Raeford Razor, MD  ?polyethylene glycol [facility] Ethelene Hal) packet Take 17 g by mouth daily as needed for moderate constipation or severe constipation. 11/24/17   Loren Racer, MD  ?   ? ?Allergies    ?Sulfa antibiotics and Sulfa antibiotics   ? ?Review of Systems   ?Review of Systems  ?Constitutional:  Positive for chills. Negative for fever.  ?HENT:  Positive for rhinorrhea and sore throat. Negative for congestion, drooling, trouble swallowing and voice change.   ?Eyes:  Positive for itching. Negative for discharge, redness and visual disturbance.  ?Respiratory:  Positive for cough. Negative for shortness of breath.   ?Cardiovascular:  Negative for chest pain.  ?Gastrointestinal:  Negative for abdominal pain, nausea and vomiting.  ?Genitourinary:  Negative for difficulty urinating, dysuria, frequency, hematuria, pelvic pain, urgency, vaginal bleeding, vaginal discharge and vaginal pain.  ?Musculoskeletal:  Negative for back pain and neck pain.  ?Skin:  Negative for color change and rash.  ?Neurological:  Negative for dizziness, syncope, light-headedness and headaches.  ?Psychiatric/Behavioral:  Negative for confusion.   ? ?Physical Exam ?Updated Vital Signs ?BP (!) 146/123   Pulse 88   Temp 98.3 ?F (36.8 ?C) (Oral)   Resp 18   Ht 5\' 2"  (1.575 m)   Wt 117 kg   SpO2 99%   BMI 47.19 kg/m?  ?Physical Exam ?Vitals and nursing note reviewed.  ?Constitutional:   ?   General: She is not in acute distress. ?  Appearance: She is not ill-appearing, toxic-appearing or diaphoretic.  ?HENT:  ?   Head: Normocephalic. No right periorbital erythema or left periorbital erythema.  ?   Jaw: No trismus.  ?   Mouth/Throat:  ?   Lips: Pink. No lesions.  ?   Mouth: Mucous membranes are moist.  ?   Tongue: No lesions. Tongue does not deviate from midline.  ?   Palate: No mass and lesions.  ?   Pharynx: Oropharynx is clear. Uvula midline. No pharyngeal swelling, oropharyngeal exudate,  posterior oropharyngeal erythema or uvula swelling.  ?   Tonsils: No tonsillar exudate or tonsillar abscesses. 1+ on the right. 1+ on the left.  ?   Comments: Handles all secretions without difficulty ?Eyes:  ?   General: No scleral icterus.    ?   Right eye: No discharge.     ?   Left eye: No discharge.  ?   Conjunctiva/sclera: Conjunctivae normal.  ?Cardiovascular:  ?   Rate and Rhythm: Normal rate.  ?Pulmonary:  ?   Effort: Pulmonary effort is normal. No tachypnea, bradypnea or respiratory distress.  ?   Breath sounds: Normal breath sounds. No stridor.  ?   Comments: Speaks in full complete sentences without difficulty ?Skin: ?   General: Skin is warm and dry.  ?Neurological:  ?   General: No focal deficit present.  ?   Mental Status: She is alert.  ?Psychiatric:     ?   Behavior: Behavior is cooperative.  ? ? ?ED Results / Procedures / Treatments   ?Labs ?(all labs ordered are listed, but only abnormal results are displayed) ?Labs Reviewed - No data to display ? ?EKG ?None ? ?Radiology ?DG Chest Port 1 View ? ?Result Date: 08/15/2021 ?CLINICAL DATA:  Cough since Friday EXAM: PORTABLE CHEST 1 VIEW COMPARISON:  June 15, 2017 FINDINGS: Heart is borderline. Mediastinal contours are within normal limits. Both lungs are clear. No consolidation, pleural effusion or vascular congestion. Mild-to-moderate thoracic spondylosis and degenerative changes of the visualized shoulder joints. IMPRESSION: No acute cardiopulmonary process. Electronically Signed   By: Marjo Bicker M.D.   On: 08/15/2021 09:56   ? ?Procedures ?Procedures  ? ? ?Medications Ordered in ED ?Medications - No data to display ? ?ED Course/ Medical Decision Making/ A&P ?  ?                        ?Medical Decision Making ?Amount and/or Complexity of Data Reviewed ?Radiology: ordered. ? ? ?Alert 66 year old female no acute distress, nontoxic-appearing.  Presents to the emergency department with a chief complaint of cough and chills. ? ?Information obtained from  patient.  Past medical records were reviewed including previous provider notes, labs, and imaging. ? ?Due to patient's report of productive cough x5 days will obtain x-ray imaging to evaluate for possible pneumonia.  With patient's constellation of symptoms concern for possible viral upper respiratory infection including but not limited to COVID-19 and influenza.  Patient refuses COVID-19 and influenza testing at this time.   ? ?I personally viewed and interpreted patient's chest x-ray.  Agree with radiology interpretation of no active cardiopulmonary disease. ? ?We will treat patient as she has a viral upper respiratory infection..  Patient advised to self isolate for 7 days from symptom onset as she is refusing COVID-19 testing at this time.  Will prescribe patient with course of Tessalon.  Discussed over-the-counter medication symptomatic treatment. ? ?While in the emergency department patient was noted  to be hypertensive.  Patient has no history of hypertension.  Patient denies any numbness, weakness, facial asymmetry, dysarthria, visual disturbance, headache; therefore low suspicion for hypertensive urgency/emergency at this time.  Discussed checking blood pressure at home and following up with primary care doctor for further management of blood pressure.  Patient is agreeable with this plan. ? ?Based on patient's chief complaint, I considered admission might be necessary, however after reassuring ED workup feel patient is reasonable for discharge.  Discussed results, findings, treatment and follow up. Patient advised of return precautions. Patient verbalized understanding and agreed with plan. ? ?Portions of this note were generated with Scientist, clinical (histocompatibility and immunogenetics)Dragon dictation software. Dictation errors may occur despite best attempts at proofreading. ? ? ? ? ? ? ? ? ? ?Final Clinical Impression(s) / ED Diagnoses ?Final diagnoses:  ?None  ? ? ?Rx / DC Orders ?ED Discharge Orders   ? ? None  ? ?  ? ? ?  ?Haskel SchroederBadalamente, Jewelianna Pancoast R,  PA-C ?08/15/21 1002 ? ?  ?Tegeler, Canary Brimhristopher J, MD ?08/15/21 1320 ? ?

## 2022-07-06 ENCOUNTER — Other Ambulatory Visit (HOSPITAL_COMMUNITY): Payer: Self-pay

## 2023-01-11 IMAGING — DX DG CHEST 1V PORT
1 series · 1 of 1 positions shown · non-contrast
Comparison: June 15, 2017

CLINICAL DATA: Cough since [REDACTED]

EXAM:
PORTABLE CHEST 1 VIEW

[chest ap]
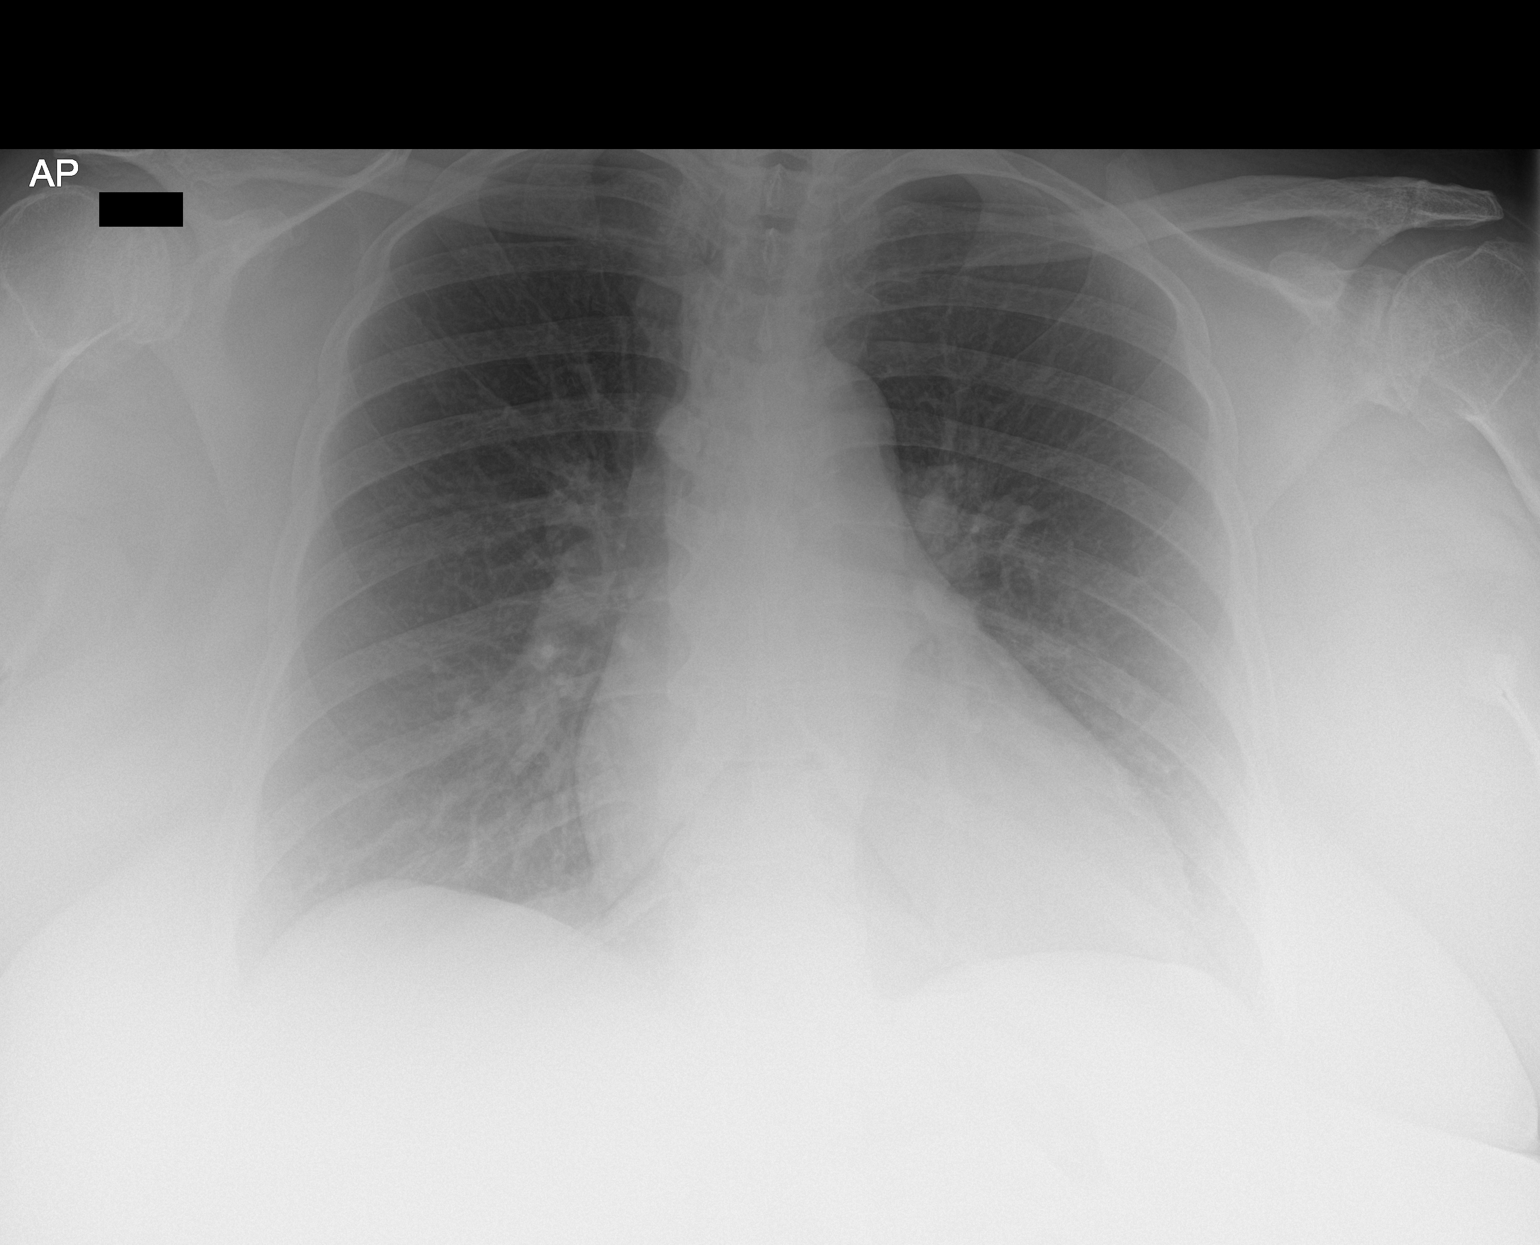

[1 of 1 positions shown; findings below may reference images not displayed]

FINDINGS: Heart is borderline. Mediastinal contours are within normal limits.
Both lungs are clear. No consolidation, pleural effusion or vascular
congestion. Mild-to-moderate thoracic spondylosis and degenerative
changes of the visualized shoulder joints.
IMPRESSION: No acute cardiopulmonary process.

## 2023-06-12 ENCOUNTER — Emergency Department (HOSPITAL_BASED_OUTPATIENT_CLINIC_OR_DEPARTMENT_OTHER)
Admission: EM | Admit: 2023-06-12 | Discharge: 2023-06-12 | Disposition: A | Discharge status: Home / Self Care | Attending: Emergency Medicine | Admitting: Emergency Medicine

## 2023-06-12 ENCOUNTER — Encounter (HOSPITAL_BASED_OUTPATIENT_CLINIC_OR_DEPARTMENT_OTHER): Payer: Self-pay | Admitting: Emergency Medicine

## 2023-06-12 ENCOUNTER — Other Ambulatory Visit: Payer: Self-pay

## 2023-06-12 DIAGNOSIS — R509 Fever, unspecified: Secondary | ICD-10-CM | POA: Diagnosis present

## 2023-06-12 DIAGNOSIS — Z79899 Other long term (current) drug therapy: Secondary | ICD-10-CM | POA: Diagnosis not present

## 2023-06-12 DIAGNOSIS — N3000 Acute cystitis without hematuria: Secondary | ICD-10-CM | POA: Diagnosis not present

## 2023-06-12 LAB — URINALYSIS, W/ REFLEX TO CULTURE (INFECTION SUSPECTED)
Bilirubin Urine: NEGATIVE
Glucose, UA: NEGATIVE mg/dL
Ketones, ur: NEGATIVE mg/dL
Nitrite: POSITIVE — AB
Protein, ur: NEGATIVE mg/dL
Specific Gravity, Urine: 1.02 (ref 1.005–1.030)
pH: 5.5 (ref 5.0–8.0)

## 2023-06-12 LAB — RESP PANEL BY RT-PCR (RSV, FLU A&B, COVID)  RVPGX2
Influenza A by PCR: NEGATIVE
Influenza B by PCR: NEGATIVE
Resp Syncytial Virus by PCR: NEGATIVE
SARS Coronavirus 2 by RT PCR: NEGATIVE

## 2023-06-12 LAB — CBG MONITORING, ED: Glucose-Capillary: 83 mg/dL (ref 70–99)

## 2023-06-12 MED ORDER — CEPHALEXIN 500 MG PO CAPS: 500.0000 mg | ORAL_CAPSULE | Freq: Two times a day (BID) | ORAL | 0 refills | Status: AC

## 2023-06-12 NOTE — ED Notes (Signed)
 ED Provider at bedside.

## 2023-06-12 NOTE — ED Provider Notes (Signed)
 [place] [facility]ENCY DEPARTMENT AT [facility] Provider Note   CSN: [id number] Arrival date & time: 06/12/23  1147     History  Chief Complaint  Patient presents with   Fever    Krystal Gomez is a 68 y.o. female.   Fever Patient presents feeling bad for around a week now.  Reported low-grade fevers.  Weakness body aches.  Rare cough.  However has had some urinary frequency.  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  Slight lower abdominal pain.  States she is felt as if she has to urinate more and does have pain when she goes.  No diarrhea or constipation.  No vomiting.     Home Medications Prior to Admission medications   Medication Sig Start Date End Date Taking? Authorizing Provider  benzonatate (TESSALON) 100 MG capsule Take 1 capsule (100 mg total) by mouth every 8 (eight) hours as needed for cough. 08/15/21   Haskel Schroeder, PA-C  cephALEXin (KEFLEX) 500 MG capsule Take 1 capsule (500 mg total) by mouth 2 (two) times daily. 06/12/23   Benjiman Core, MD  cyclobenzaprine (FLEXERIL) 10 MG tablet Take 1 tablet (10 mg total) by mouth 3 (three) times daily as needed for muscle spasms. 07/15/17   Raeford Razor, MD  dicyclomine (BENTYL) 20 MG tablet Take 1 tablet (20 mg total) by mouth 2 (two) times daily as needed for spasms. 11/24/17   Loren Racer, MD  meclizine (ANTIVERT) 25 MG tablet Take 1 tablet (25 mg total) by mouth 3 (three) times daily as needed for dizziness. 06/15/17   Pricilla Loveless, MD  naproxen (NAPROSYN) 375 MG tablet Take 1 tablet (375 mg total) by mouth 2 (two) times daily as needed. 07/15/17   Raeford Razor, MD  polyethylene glycol [facility] / Ethelene Hal) packet Take 17 g by mouth daily as needed for moderate constipation or severe constipation. 11/24/17   Loren Racer, MD      Allergies    Sulfa antibiotics and Sulfa antibiotics    Review of Systems   Review of Systems  Constitutional:  Positive for fever.    Physical  Exam Updated Vital Signs BP (!) 162/89 (BP Location: Left Arm)   Pulse 97   Temp 97.9 F (36.6 C)   Resp 18   Ht 5\' 2"  (1.575 m)   Wt 117.9 kg   SpO2 98%   BMI 47.55 kg/m  Physical Exam Vitals and nursing note reviewed.  Cardiovascular:     Rate and Rhythm: Normal rate.  Pulmonary:     Breath sounds: No wheezing or rhonchi.  Abdominal:     Tenderness: There is abdominal tenderness.     Comments: Suprapubic tenderness without rebound or guarding.  No hernia palpated.  Genitourinary:    Comments: No CVA tenderness Musculoskeletal:     Cervical back: Neck supple.  Skin:    Capillary Refill: Capillary refill takes less than 2 seconds.  Neurological:     Mental Status: She is alert.     ED Results / Procedures / Treatments   Labs (all labs ordered are listed, but only abnormal results are displayed) Labs Reviewed  URINALYSIS, W/ REFLEX TO CULTURE (INFECTION SUSPECTED) - Abnormal; Notable for the following components:      Result Value   Hgb urine dipstick SMALL (*)    Nitrite POSITIVE (*)    Leukocytes,Ua MODERATE (*)    Bacteria, UA MANY (*)    All other components within normal limits  RESP PANEL  BY RT-PCR (RSV, FLU A&B, COVID)  [id number]  URINE CULTURE  CBG MONITORING, ED    EKG None  Radiology No results found.  Procedures Procedures    Medications Ordered in ED Medications - No data to display  ED Course/ Medical Decision Making/ A&P                                 Medical Decision Making Amount and/or Complexity of Data Reviewed Labs: ordered.  Risk Prescription drug management.  Patient with reported myalgias and fever.  Well-appearing.  Respiratory considered since has had a little bit of cough but negative flu COVID and RSV testing.  Lungs are clear on exam.  However with suprapubic pain and tenderness will get urinalysis.  Also will get CBG since states she has had more frequency.  Urinalysis shows likely infection.  Does have some squamous  cells but with symptoms we will treat.  Culture sent.  Will give Keflex.  Appears stable for discharge home however.        Final Clinical Impression(s) / ED Diagnoses Final diagnoses:  Acute cystitis without hematuria    Rx / DC Orders ED Discharge Orders          Ordered    cephALEXin (KEFLEX) 500 MG capsule  2 times daily        06/12/23 1413              Benjiman Core, MD 06/12/23 1448

## 2023-06-12 NOTE — ED Notes (Signed)
 Called lab, they will add on urine culture

## 2023-06-12 NOTE — ED Notes (Signed)
Report rec'd from prev RN 

## 2023-06-12 NOTE — ED Triage Notes (Signed)
 C/o fever, weakness, body aches x 1 week.

## 2023-06-14 LAB — URINE CULTURE: Culture: >=100000 — AB

## 2023-06-15 ENCOUNTER — Telehealth (HOSPITAL_BASED_OUTPATIENT_CLINIC_OR_DEPARTMENT_OTHER): Payer: Self-pay | Admitting: *Deleted

## 2023-06-15 NOTE — Telephone Encounter (Signed)
 Post ED Visit - Positive Culture Follow-up  Culture report reviewed by antimicrobial stewardship pharmacist: [facility] []  Enzo Bi, Pharm.D. []  Celedonio Miyamoto, Pharm.D., BCPS AQ-ID []  Garvin Fila, Pharm.D., BCPS []  Georgina Pillion, [address].D., BCPS []  [place], [address].D., BCPS, AAHIVP []  Estella Husk, Pharm.D., BCPS, AAHIVP []  Lysle Pearl, PharmD, BCPS []  Phillips Climes, PharmD, BCPS []  Agapito Games, PharmD, BCPS []  Verlan Friends, PharmD []  Mervyn Gay, PharmD, BCPS [x]  Estill Batten, PharmD  [facility] []  Len Childs, PharmD []  Greer Pickerel, PharmD []  Adalberto Cole, PharmD []  Perlie Gold, Rph []  Lonell Face) Jean Rosenthal, PharmD []  Earl Many, PharmD []  Junita Push, PharmD []  Dorna Leitz, PharmD []  Terrilee Files, PharmD []  Lynann Beaver, PharmD []  Keturah Barre, PharmD []  Loralee Pacas, PharmD []  Bernadene Person, PharmD   Positive urine culture Treated with Cephalexin, organism sensitive to the same and no further patient follow-up is required at this time.  Krystal Gomez 06/15/2023, 11:27 AM

## 2024-04-07 ENCOUNTER — Other Ambulatory Visit: Payer: Self-pay
